# Patient Record
Sex: Male | Born: 1950 | Race: White | Hispanic: No | Marital: Married
Health system: Southern US, Community
[De-identification: ages and names within clinical notes are randomized; demographics above are authoritative.]

---

## 1998-02-22 ENCOUNTER — Other Ambulatory Visit: Admission: RE | Admit: 1998-02-22 | Discharge: 1998-02-22 | Payer: Self-pay | Admitting: Internal Medicine

## 1999-10-14 ENCOUNTER — Ambulatory Visit (HOSPITAL_COMMUNITY): Admission: RE | Admit: 1999-10-14 | Discharge: 1999-10-14 | Payer: Self-pay | Admitting: *Deleted

## 2000-12-24 ENCOUNTER — Ambulatory Visit (HOSPITAL_COMMUNITY): Admission: RE | Admit: 2000-12-24 | Discharge: 2000-12-24 | Payer: Self-pay | Admitting: *Deleted

## 2003-04-10 ENCOUNTER — Ambulatory Visit (HOSPITAL_COMMUNITY): Admission: RE | Admit: 2003-04-10 | Discharge: 2003-04-10 | Payer: Self-pay | Admitting: *Deleted

## 2003-05-03 ENCOUNTER — Ambulatory Visit (HOSPITAL_COMMUNITY): Admission: RE | Admit: 2003-05-03 | Discharge: 2003-05-03 | Payer: Self-pay | Admitting: *Deleted

## 2003-05-24 ENCOUNTER — Ambulatory Visit (HOSPITAL_COMMUNITY): Admission: RE | Admit: 2003-05-24 | Discharge: 2003-05-24 | Payer: Self-pay | Admitting: Internal Medicine

## 2004-08-22 IMAGING — CR DG CHEST 2V
2 series · 2 of 2 positions shown · non-contrast
Comparison: none

CLINICAL DATA: Persistent cough for three months.  Dyspnea with exertion.

[view not recorded (1 of 2)]
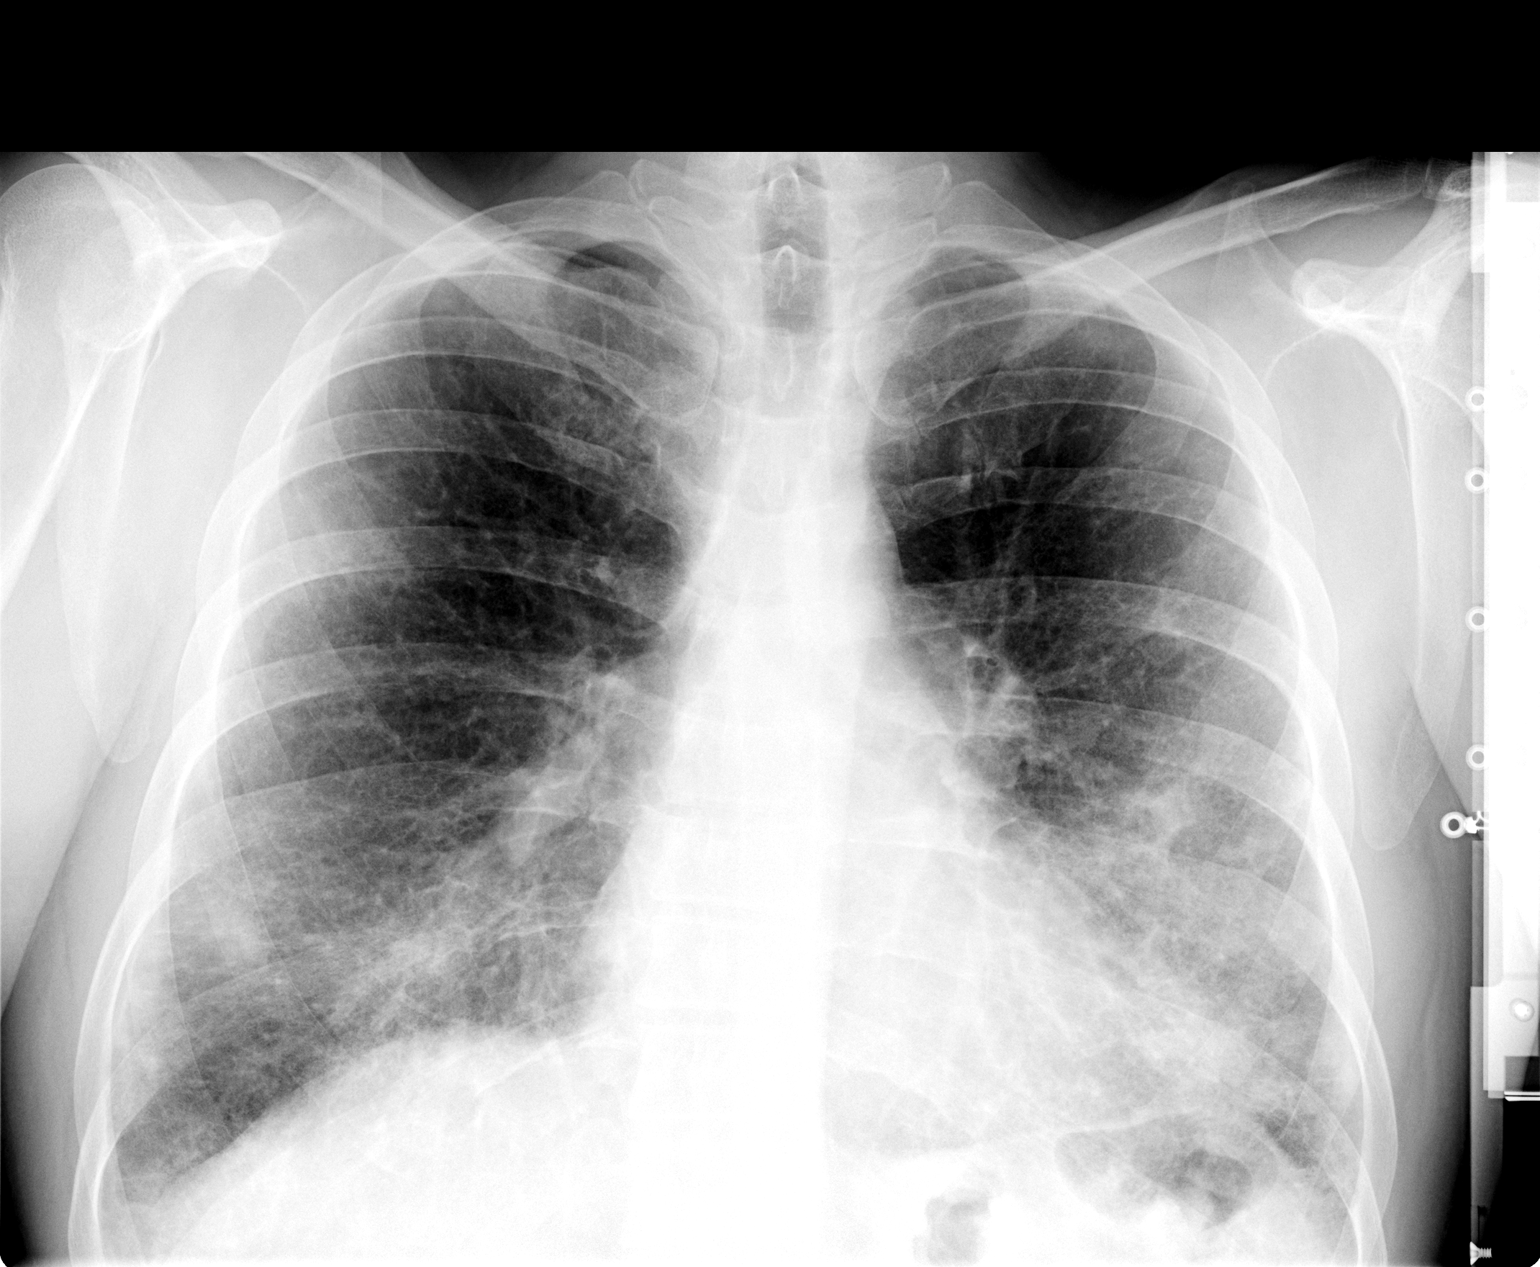

[view not recorded (2 of 2)]
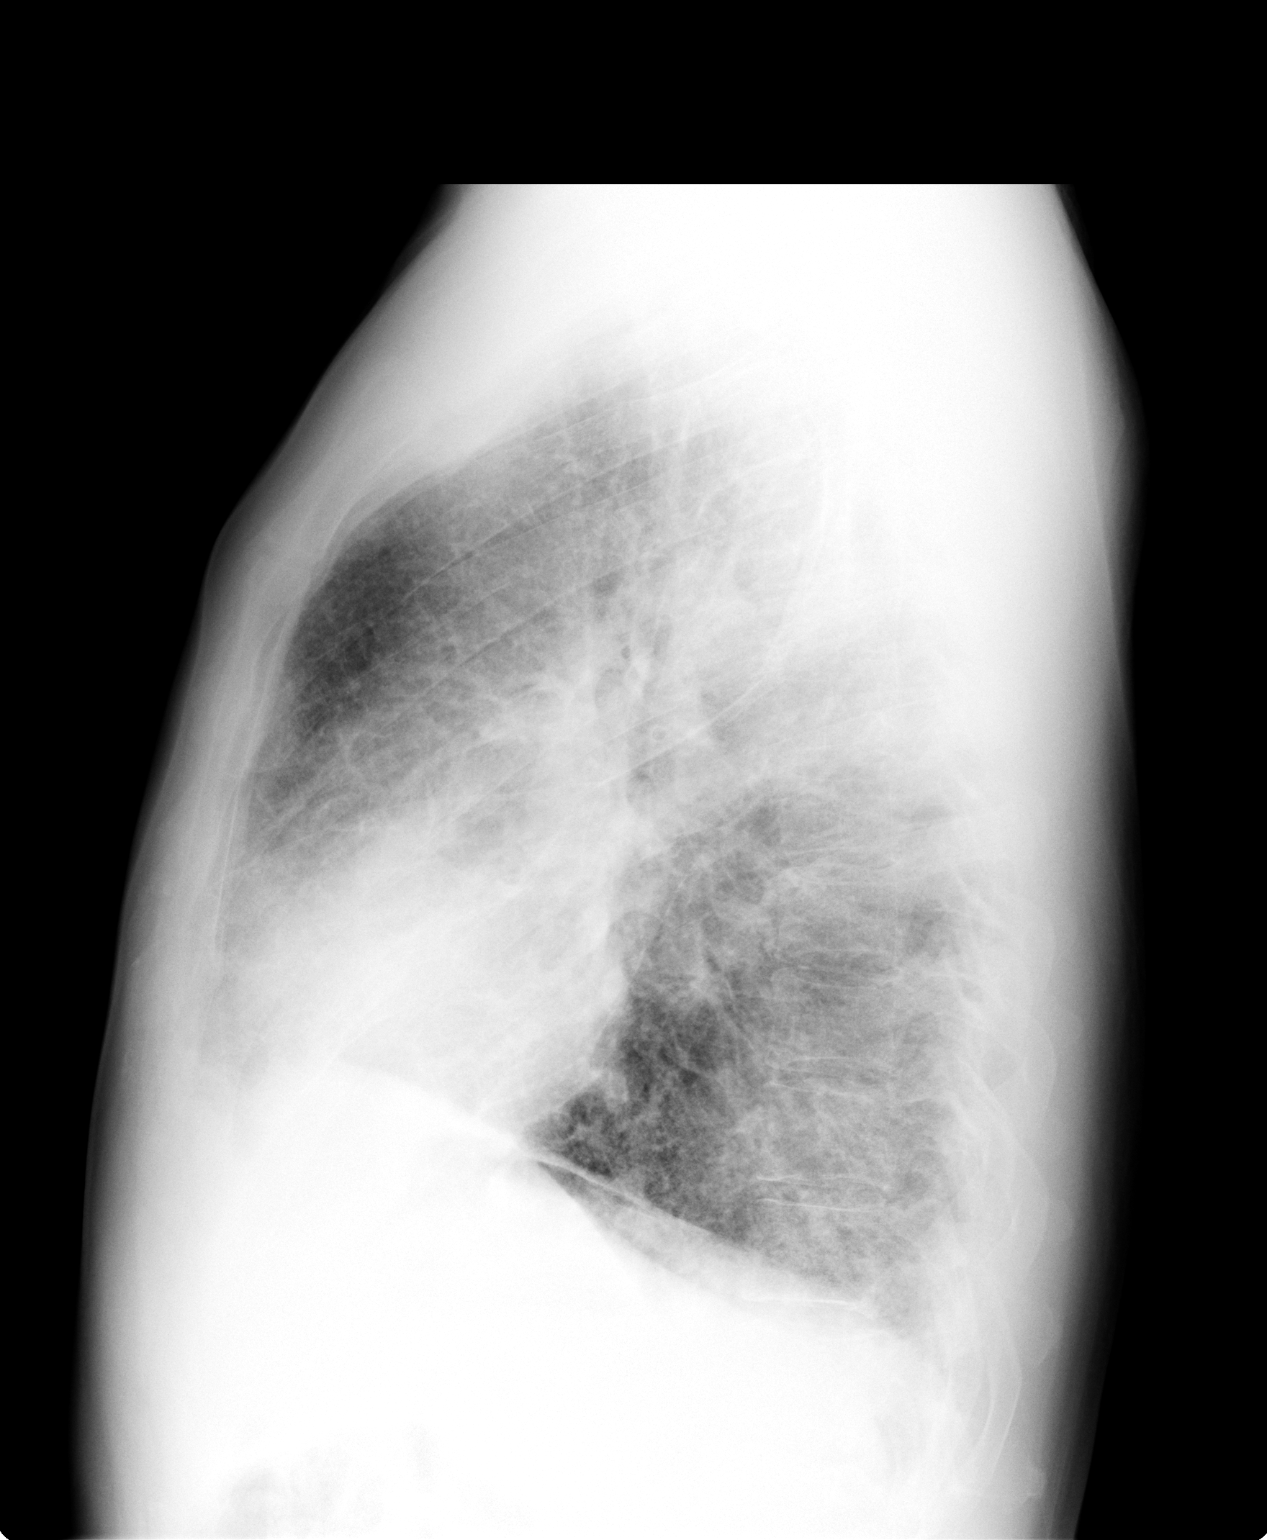

[2 of 2 positions shown; findings below may reference images not displayed]

PA AND LATERAL CHEST
 No comparison.  The heart size is normal.  There is no evidence of mediastinal or hilar adenopathy.  There is diffuse interstitial prominence with peribronchial thickening.  Patchy opacities are present in the right middle lobe and lingula.  There is no consolidation or significant pleural effusion.  Osseous structures appear unremarkable. 

 IMPRESSION
 Abnormal chest radiograph demonstrating diffuse interstitial prominence and patchy right middle lobe and lingular opacities.  Although potentially chronic, without prior exams, I cannot exclude superimposed pneumonia.  Correlation with any old films the patient may have would be helpful.  In their absence, I would suggest short term (1-2 week) chest radiographic follow-up. 

 [REDACTED]

## 2004-10-05 IMAGING — CT CT CHEST W/O CM
1 of 3 series · 14 of 30 positions shown, 18 images · non-contrast
Comparison: none

CLINICAL DATA: Abnormal chest x-ray/fatigue/decreased O2 saturations.
CT CHEST WITHOUT CONTRAST
TECHNIQUE: Multidetector helical imaging carried out through the chest without IV contrast.  Today?s study is correlated with prior chest x-rays from 04/10/03 and 05/03/03.
There are patchy lung densities scattered throughout the lungs bilaterally.  They are most notable at the anterior aspect of the left lung base.  To a lesser degree these are present in the lung bases bilaterally .  There is also some left upper lobe patchy disease.  There are no air bronchograms at the present time.  The changes are less impressive on CT than they are on the patient?s recent chest x-ray, but one cannot accurately compare the two modalities.  I suspect the findings are most consistent with incompletely resolved pneumonia.  There are a few small nodules noted.  On image #40, there is a 4-5 mm nodule in the anterior aspect of the right lower lobe.  On image #34, there is a 6-7 mm nodule in the medial aspect of the left upper lobe, abutting the mediastinum.  No obvious airway occlusion.  No significant mediastinal adenopathy, given the limitations of scanning without IV contrast.  There do appear to be some scattered lymph nodes in the mediastinum in the 10-12 mm size range.  
IMPRESSION
1.  Patchy bilateral airspace disease ? likely improved when compared to recent chest x-rays, but an accurate comparison between CT and chest x-ray is not possible. 
2.  There are at least two small lung nodules.  
3.  Follow up unenhanced CT recommended in two to three months, to further assess #1 and #2.

[Series 4: — · axial · 0.80mm/px · z∈[+859,+1189]mm · 14 of 78 slices shown, 18 images]
[im 6/78  mediastinal]
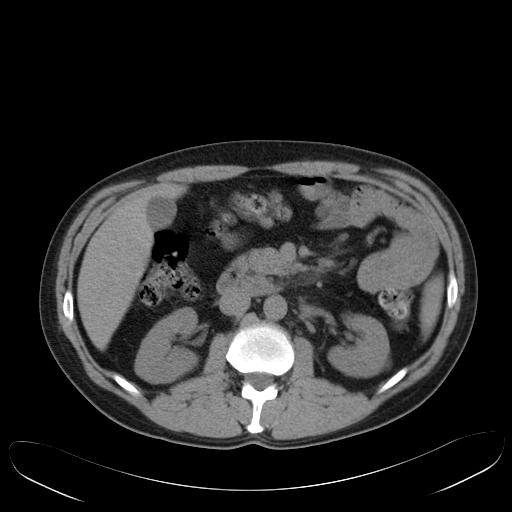
[im 6/78  lung]
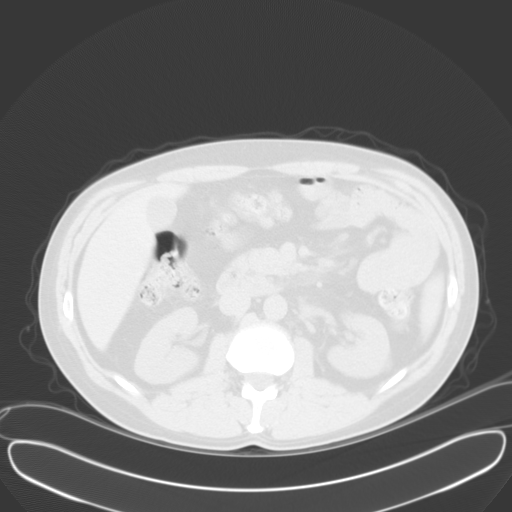
[im 12/78  lung]
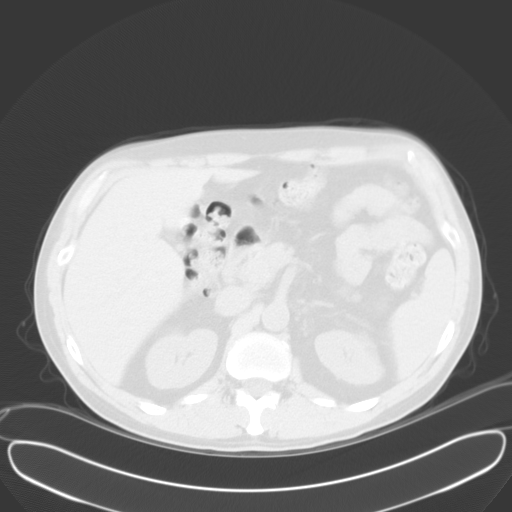
[im 17/78  lung]
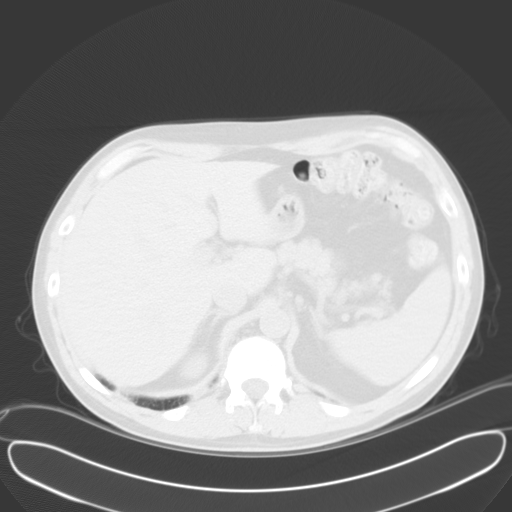
[im 23/78  lung]
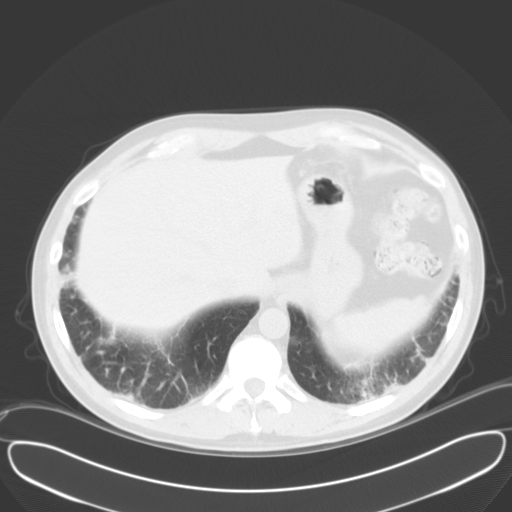
[im 28/78  mediastinal]
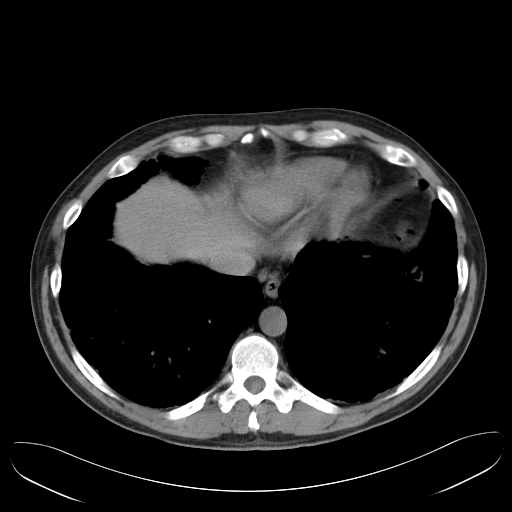
[im 28/78  lung]
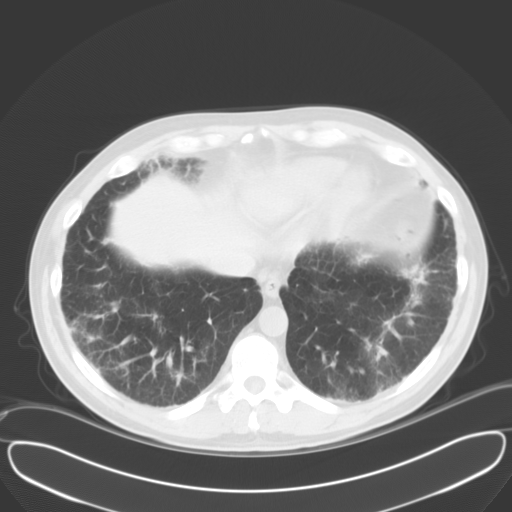
[im 34/78  lung]
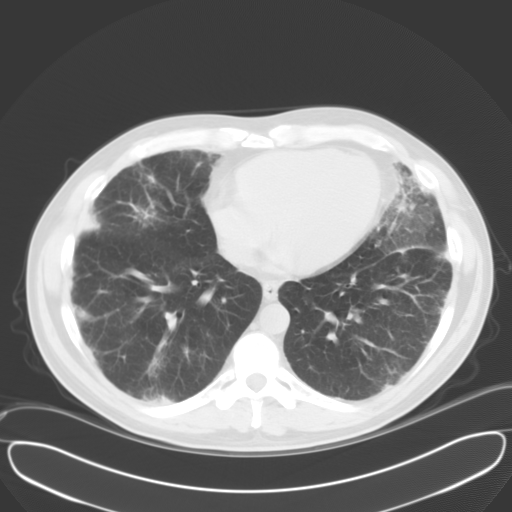
[im 38/78  lung]
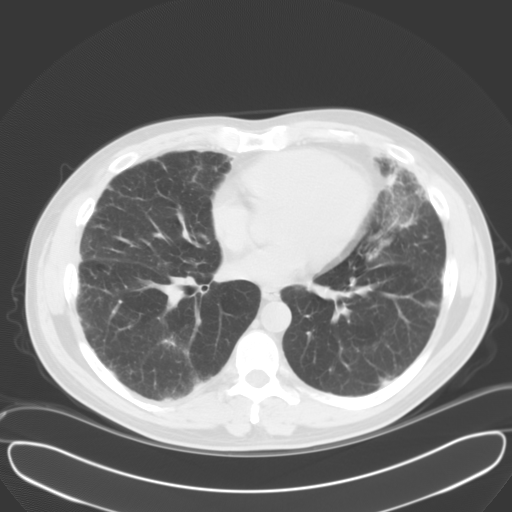
[im 39/78  lung]
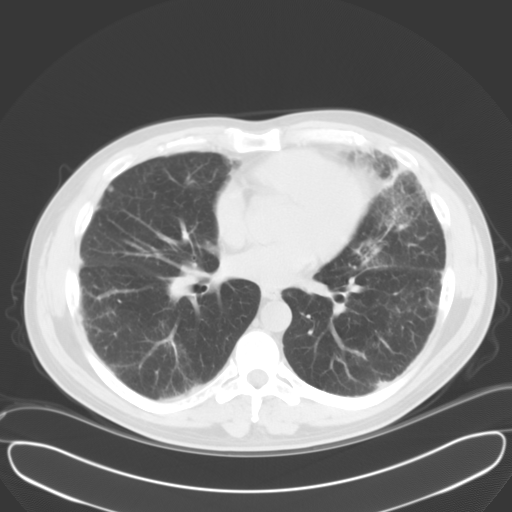
[im 45/78  mediastinal]
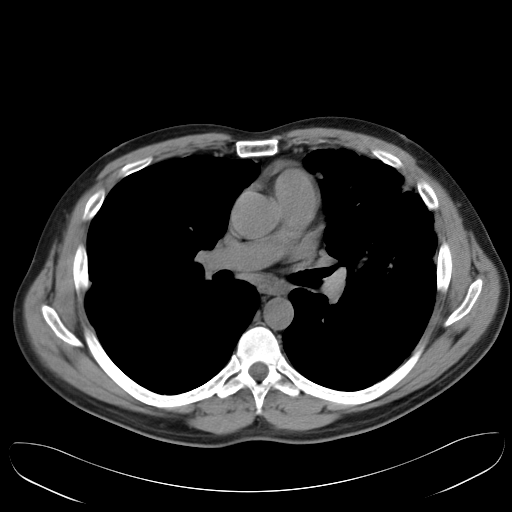
[im 45/78  lung]
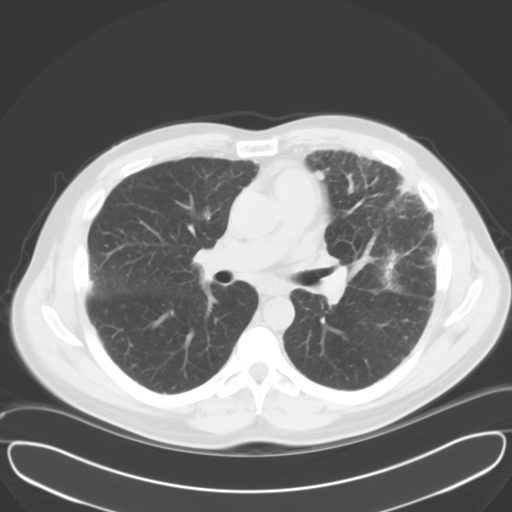
[im 50/78  lung]
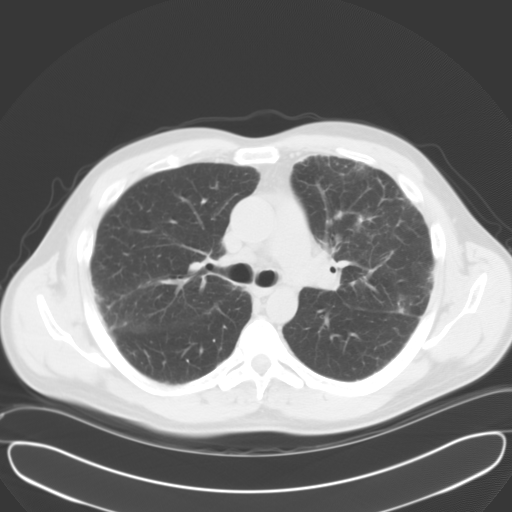
[im 56/78  lung]
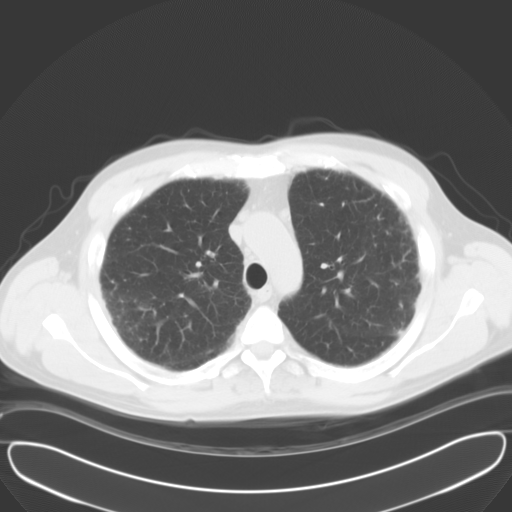
[im 61/78  lung]
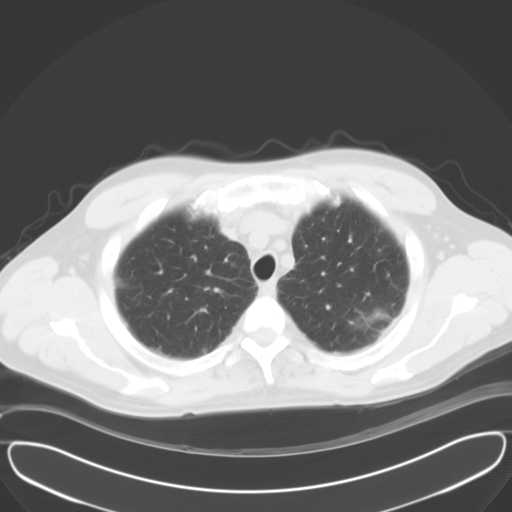
[im 67/78  mediastinal]
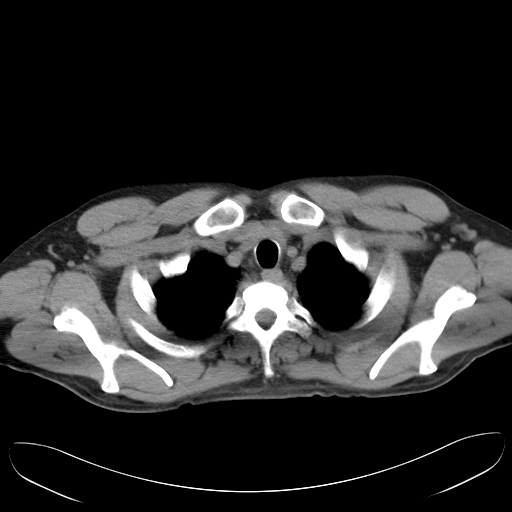
[im 67/78  lung]
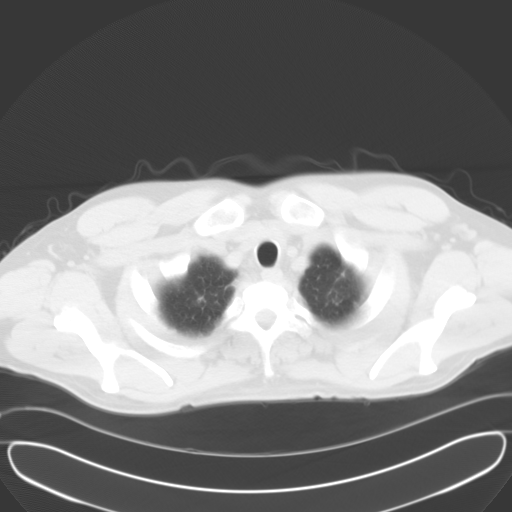
[im 72/78  lung]
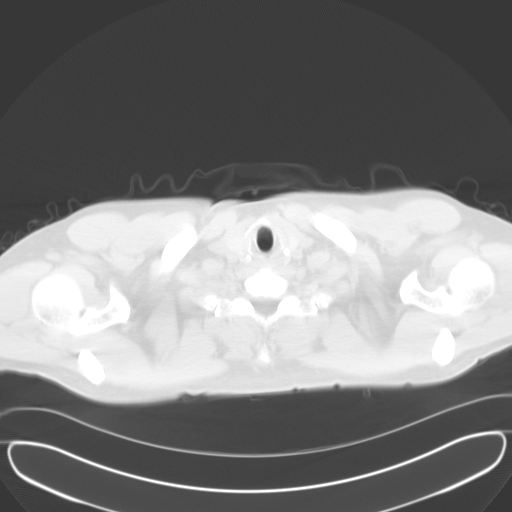

[14 of 30 positions shown; findings below may reference images not displayed]

## 2013-05-26 ENCOUNTER — Other Ambulatory Visit: Payer: Self-pay | Admitting: Gastroenterology

## 2017-06-18 ENCOUNTER — Ambulatory Visit: Payer: Self-pay

## 2017-06-18 ENCOUNTER — Ambulatory Visit (INDEPENDENT_AMBULATORY_CARE_PROVIDER_SITE_OTHER): Payer: Managed Care, Other (non HMO) | Admitting: Podiatry

## 2017-06-18 ENCOUNTER — Ambulatory Visit (INDEPENDENT_AMBULATORY_CARE_PROVIDER_SITE_OTHER): Payer: Managed Care, Other (non HMO)

## 2017-06-18 DIAGNOSIS — M2021 Hallux rigidus, right foot: Secondary | ICD-10-CM

## 2017-06-18 DIAGNOSIS — M19079 Primary osteoarthritis, unspecified ankle and foot: Secondary | ICD-10-CM | POA: Diagnosis not present

## 2017-06-18 DIAGNOSIS — M79671 Pain in right foot: Secondary | ICD-10-CM | POA: Diagnosis not present

## 2017-06-18 DIAGNOSIS — M2022 Hallux rigidus, left foot: Secondary | ICD-10-CM | POA: Diagnosis not present

## 2017-06-18 DIAGNOSIS — M79672 Pain in left foot: Secondary | ICD-10-CM

## 2017-06-18 MED ORDER — MELOXICAM 15 MG PO TABS: 15.0000 mg | ORAL_TABLET | Freq: Every day | ORAL | 0 refills | Status: DC

## 2017-06-20 NOTE — Progress Notes (Signed)
  Subjective:  Patient ID: Frank Patel, male    DOB: 08/01/1950,  MRN: [id number]  Chief Complaint  Patient presents with  . Foot Pain    bilateral x years - has become very difficult to live with   68 y.o. male presents with the above complaint. Reports pain to both great toe joint. Present for many years. Denies prior treatments.  No past medical history on file.  Current Outpatient Medications:  .  meloxicam (MOBIC) 15 MG tablet, Take 1 tablet (15 mg total) by mouth daily., Disp: 30 tablet, Rfl: 0  Allergies not on file Review of Systems: Negative except as noted in the HPI. Denies N/V/F/Ch. Objective:  There were no vitals filed for this visit. General AA&O x3. Normal mood and affect.  Vascular Dorsalis pedis and posterior tibial pulses  present 2+ bilaterally  Capillary refill normal to all digits. Pedal hair growth normal.  Neurologic Epicritic sensation grossly present.  Dermatologic No open lesions. Interspaces clear of maceration. Nails well groomed and normal in appearance.  Orthopedic: MMT 5/5 in dorsiflexion, plantarflexion, inversion, and eversion. Decreased 1st MPJ ROM bilat with pain on rom.   Assessment & Plan:  Patient was evaluated and treated and all questions answered.  Hallux Rigidus -XR taken and reviewed. 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 -Injection delivered R 1st MPJ -Discussed possible joint replacement in the future if pain persists. -Rx Meloxicam  Procedure: Joint Injection Location: Right 1st MPJ joint Skin Prep: Alcohol. Injectate: 0.5 cc 1% lidocaine plain, 0.5 cc dexamethasone phosphate. Disposition: Patient tolerated procedure well. Injection site dressed with a band-aid.    Return in about 3 weeks (around 07/09/2017) for Arthritis,.

## 2017-07-22 ENCOUNTER — Other Ambulatory Visit: Payer: Self-pay

## 2017-07-22 ENCOUNTER — Ambulatory Visit (INDEPENDENT_AMBULATORY_CARE_PROVIDER_SITE_OTHER): Payer: Managed Care, Other (non HMO) | Admitting: Orthotics

## 2017-07-22 ENCOUNTER — Ambulatory Visit (INDEPENDENT_AMBULATORY_CARE_PROVIDER_SITE_OTHER): Payer: Managed Care, Other (non HMO) | Admitting: Podiatry

## 2017-07-22 DIAGNOSIS — M2022 Hallux rigidus, left foot: Secondary | ICD-10-CM

## 2017-07-22 DIAGNOSIS — M19079 Primary osteoarthritis, unspecified ankle and foot: Secondary | ICD-10-CM

## 2017-07-22 DIAGNOSIS — M2021 Hallux rigidus, right foot: Secondary | ICD-10-CM

## 2017-07-22 NOTE — Progress Notes (Signed)
  Subjective:  Patient ID: Frank Patel, male    DOB: [birth year],  MRN: [id number]  No chief complaint on file.  67 y.o. male returns for the above complaint. States the injections only helped for a couple days. Here for orthotic fabrication and discussion of possible surgery.  Objective:  There were no vitals filed for this visit. General AA&O x3. Normal mood and affect.  Vascular Pedal pulses palpable.  Neurologic Epicritic sensation grossly intact.  Dermatologic No open lesions. Skin normal texture and turgor.  Orthopedic: Pain and limited ROM bilateral 1st MPJ   Assessment & Plan:  Patient was evaluated and treated and all questions answered.  Hallux LImitus Bilat -Injections didn't help much. -Will fabricate CMOs with Morton's extension. -Discussed fusion vs replacement of the big toe joint. All r/b/a discussed. Questions answered at length. Patient will think it over and we will discuss further at next visit. Will see if orthotics can help.   15 minutes of face to face time were spent with the patient. >50% of this was spent on counseling and coordination of care. Specifically discussed with patient the above diagnoses and overall treatment plan.   No follow-ups on file.

## 2017-07-22 NOTE — Progress Notes (Signed)
Patient came into today to be cast for Custom Foot Orthotics. Upon recommendation of Dr. 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

## 2017-08-07 ENCOUNTER — Other Ambulatory Visit: Payer: Self-pay | Admitting: Podiatry

## 2017-08-12 ENCOUNTER — Ambulatory Visit (INDEPENDENT_AMBULATORY_CARE_PROVIDER_SITE_OTHER): Payer: Managed Care, Other (non HMO) | Admitting: Orthotics

## 2017-08-12 DIAGNOSIS — M19079 Primary osteoarthritis, unspecified ankle and foot: Secondary | ICD-10-CM

## 2017-08-12 DIAGNOSIS — M2022 Hallux rigidus, left foot: Principal | ICD-10-CM

## 2017-08-12 DIAGNOSIS — M2021 Hallux rigidus, right foot: Secondary | ICD-10-CM

## 2017-08-12 NOTE — Progress Notes (Signed)
Patient came in today to pick up custom made foot orthotics.  The goals were accomplished and the patient reported no dissatisfaction with said orthotics.  Patient was advised of breakin period and how to report any issues. 

## 2017-09-02 ENCOUNTER — Ambulatory Visit (INDEPENDENT_AMBULATORY_CARE_PROVIDER_SITE_OTHER): Payer: Managed Care, Other (non HMO) | Admitting: Podiatry

## 2017-09-02 DIAGNOSIS — M2022 Hallux rigidus, left foot: Secondary | ICD-10-CM

## 2017-09-02 DIAGNOSIS — M19079 Primary osteoarthritis, unspecified ankle and foot: Secondary | ICD-10-CM

## 2017-09-02 DIAGNOSIS — M2021 Hallux rigidus, right foot: Secondary | ICD-10-CM

## 2017-09-02 NOTE — Patient Instructions (Signed)
Pre-Operative Instructions  Congratulations, you have decided to take an important step towards improving your quality of life.  You can be assured that the doctors and staff at [facility] will be with you every step of the way.  Here are some important things you should know:  1. Plan to be at the surgery center/hospital at least 1 (one) hour prior to your scheduled time, unless otherwise directed by the surgical center/hospital staff.  You must have a responsible adult accompany you, remain during the surgery and drive you home.  Make sure you have directions to the surgical center/hospital to ensure you arrive on time. 2. If you are having surgery at [facility] or [facility], you will need a copy of your medical history and physical form from your family physician within one month prior to the date of surgery. We will give you a form for your primary physician to complete.  3. We make every effort to accommodate the date you request for surgery.  However, there are times where surgery dates or times have to be moved.  We will contact you as soon as possible if a change in schedule is required.   4. No aspirin/ibuprofen for one week before surgery.  If you are on aspirin, any non-steroidal anti-inflammatory medications (Mobic, Aleve, Ibuprofen) should not be taken seven (7) days prior to your surgery.  You make take Tylenol for pain prior to surgery.  5. Medications - If you are taking daily heart and blood pressure medications, seizure, reflux, allergy, asthma, anxiety, pain or diabetes medications, make sure you notify the surgery center/hospital before the day of surgery so they can tell you which medications you should take or avoid the day of surgery. 6. No food or drink after midnight the night before surgery unless directed otherwise by surgical center/hospital staff. 7. No alcoholic beverages 24-hours prior to surgery.  No smoking 24-hours prior or 24-hours after  surgery. 8. Wear loose pants or shorts. They should be loose enough to fit over bandages, boots, and casts. 9. Don't wear slip-on shoes. Sneakers are preferred. 10. Bring your boot with you to the surgery center/hospital.  Also bring crutches or a walker if your physician has prescribed it for you.  If you do not have this equipment, it will be provided for you after surgery. 11. If you have not been contacted by the surgery center/hospital by the day before your surgery, call to confirm the date and time of your surgery. 12. Leave-time from work may vary depending on the type of surgery you have.  Appropriate arrangements should be made prior to surgery with your employer. 13. Prescriptions will be provided immediately following surgery by your doctor.  Fill these as soon as possible after surgery and take the medication as directed. Pain medications will not be refilled on weekends and must be approved by the doctor. 14. Remove nail polish on the operative foot and avoid getting pedicures prior to surgery. 15. Wash the night before surgery.  The night before surgery wash the foot and leg well with water and the antibacterial soap provided. Be sure to pay special attention to beneath the toenails and in between the toes.  Wash for at least three (3) minutes. Rinse thoroughly with water and dry well with a towel.  Perform this wash unless told not to do so by your physician.  Enclosed: 1 Ice pack (please put in freezer the night before surgery)   1 Hibiclens skin cleaner     Pre-op instructions  If you have any questions regarding the instructions, please do not hesitate to call our office.  [place]: [address], [place], [place] [address] -- [phone]  [place]: [address] [place], [place] [address] -- [phone]  [place]: [address]  [place], [place] [address] -- [phone]  [place]: [address], [address], [place], [place] [address] -- [phone]  Website:  [email or web address] 

## 2017-09-02 NOTE — Progress Notes (Signed)
  Subjective:  Patient ID: Frank Patel, male    DOB: Jul 28, 1950,  MRN: [id number]  Chief Complaint  Patient presents with  . Foot Orthotics    6 week follow up orthotics   67 y.o. male returns for the above complaint.  States that orthotics are helping some but he still having pain especially when he does a lot of activity.  Would like to further discuss surgical intervention as discussed last visit.  Has more pain to his left foot than his right  Objective:  There were no vitals filed for this visit. General AA&O x3. Normal mood and affect.  Vascular Pedal pulses palpable.  Neurologic Epicritic sensation grossly intact.  Dermatologic No open lesions. Skin normal texture and turgor.  Orthopedic:  Hallux rigidus bilateral with pain on attempted range of motion limited range of motion bilaterally.   Assessment & Plan:  Patient was evaluated and treated and all questions answered.  Hallux rigidus bilateral left greater than right -Discussed continued conservative versus surgical care hallux rigidus patient elects for surgical intervention.  Discussed benefit of performing great toe joint replacement.  Patient wishes to proceed.  We will plan for procedure in the coming weeks.  Consent reviewed and signed by patient all response alternatives explained to patient no guarantees given.  Specific discussed the patient that he may need to have a second surgery later on should the joint breakdown or loosen.  Return for for post-op care.

## 2017-09-08 ENCOUNTER — Telehealth: Payer: Self-pay | Admitting: *Deleted

## 2017-09-08 NOTE — Telephone Encounter (Signed)
"  This is Frank Patel.  I was there last week to see Dr. March Rummage.  We scheduled surgery for July 17.  I don't know what time to show up at [facility].  I didn't see a time on any of my information.  I also wanted to ask if the surgical center does the insurance certification or if your office does the insurance certification?  I'd appreciate a call back."  I called and left him a message that I will get authorization for the professional fee and the facility as well as anesthesia will get authorization for their fees as well.  I told him he doesn't have to do anything.  I also informed him that someone from the surgical center would call him a day or two prior to his surgery date and they would give him his arrival time.  I asked him to call if he has any other questions.

## 2017-09-19 ENCOUNTER — Other Ambulatory Visit: Payer: Self-pay | Admitting: Podiatry

## 2017-09-22 ENCOUNTER — Encounter: Payer: Self-pay | Admitting: Podiatry

## 2017-09-22 ENCOUNTER — Other Ambulatory Visit: Payer: Self-pay | Admitting: Podiatry

## 2017-09-22 DIAGNOSIS — M2012 Hallux valgus (acquired), left foot: Secondary | ICD-10-CM | POA: Diagnosis not present

## 2017-09-22 MED ORDER — OXYCODONE-ACETAMINOPHEN 10-325 MG PO TABS: 1.0000 | ORAL_TABLET | ORAL | 0 refills | Status: DC | PRN

## 2017-09-22 MED ORDER — CEPHALEXIN 500 MG PO CAPS: 500.0000 mg | ORAL_CAPSULE | Freq: Two times a day (BID) | ORAL | 0 refills | Status: AC

## 2017-09-22 MED ORDER — ONDANSETRON HCL 4 MG PO TABS: 4.0000 mg | ORAL_TABLET | Freq: Three times a day (TID) | ORAL | 0 refills | Status: AC | PRN

## 2017-09-24 ENCOUNTER — Ambulatory Visit (INDEPENDENT_AMBULATORY_CARE_PROVIDER_SITE_OTHER): Payer: Managed Care, Other (non HMO)

## 2017-09-24 ENCOUNTER — Encounter: Payer: Self-pay | Admitting: Podiatry

## 2017-09-24 ENCOUNTER — Ambulatory Visit (INDEPENDENT_AMBULATORY_CARE_PROVIDER_SITE_OTHER): Payer: Self-pay | Admitting: Podiatry

## 2017-09-24 VITALS — Temp 96.6°F

## 2017-09-24 DIAGNOSIS — M2022 Hallux rigidus, left foot: Secondary | ICD-10-CM

## 2017-09-24 DIAGNOSIS — M2021 Hallux rigidus, right foot: Secondary | ICD-10-CM

## 2017-09-24 MED ORDER — OXYCODONE-ACETAMINOPHEN 10-325 MG PO TABS: 1.0000 | ORAL_TABLET | ORAL | 0 refills | Status: AC | PRN

## 2017-09-26 NOTE — Progress Notes (Signed)
  Subjective:  Patient ID: Frank Patel, male    DOB: 1950/05/15,  MRN: [id number]  Chief Complaint  Patient presents with  . Routine Post [facility] 07.17.2019 [facility] Implant Lt " the block wore off, and my foot hurts now"     DOS: 09/22/17 Procedure: Left 1st MPJ implant arthroplasty.  67 y.o. male returns for post-op check. Denies N/V/F/Ch. Pain is controlled with current medications.  States the pain was controlled until the block wore off.  Objective:   General AA&O x3. Normal mood and affect.  Vascular Foot warm and well perfused.  Neurologic Gross sensation intact.  Dermatologic Skin healing well without signs of infection. Skin edges well coapted without signs of infection.  Orthopedic: Tenderness to palpation noted about the surgical site.    Assessment & Plan:  Patient was evaluated and treated and all questions answered.  S/p Left 1st MPJ implant arthroplasty. -Progressing as expected post-operatively. -Sutures: intact. -Medications refilled: Percocet -Foot redressed.  No follow-ups on file.

## 2017-10-01 ENCOUNTER — Encounter: Payer: Self-pay | Admitting: Podiatry

## 2017-10-01 ENCOUNTER — Ambulatory Visit (INDEPENDENT_AMBULATORY_CARE_PROVIDER_SITE_OTHER): Payer: Self-pay | Admitting: Podiatry

## 2017-10-01 DIAGNOSIS — M2022 Hallux rigidus, left foot: Secondary | ICD-10-CM | POA: Diagnosis not present

## 2017-10-01 DIAGNOSIS — M2021 Hallux rigidus, right foot: Secondary | ICD-10-CM

## 2017-10-02 NOTE — Progress Notes (Signed)
Subjective:   Patient ID: Frank Patel, male   DOB: 67 y.o.   MRN: [id number]   HPI Patient states overall doing pretty good and states that he has been walking around the house without his boot or any form of immobilization.  Several weeks after having implant procedure of the first MPJ left   ROS      Objective:  Physical Exam  Neurovascular status intact negative Homans sign noted the patient does have moderate excessive swelling in the foot and has not been taking proper care of it and has been ambulating without his boot against instructions a physician.  His incision site overall is healing well with slight distal breakdown that is very localized with no proximal edema erythema or drainage noted     Assessment:  Overall doing well the patient has been noncompliant in his walking     Plan:  I reviewed with him that he has to keep it immobilized in the importance of doing this and I did discuss the edema in his foot is probably due to his excessive activity.  At this point I discussed the importance of elevation immobilization and compression and I reapplied sterile dressing and instructed on the importance of using the boot of the shoe.  Patient will be seen back to recheck again in the next few weeks by Dr. Halina Maidens indicates that the implant is seated well

## 2017-10-07 ENCOUNTER — Ambulatory Visit (INDEPENDENT_AMBULATORY_CARE_PROVIDER_SITE_OTHER): Payer: Managed Care, Other (non HMO)

## 2017-10-07 DIAGNOSIS — M2022 Hallux rigidus, left foot: Secondary | ICD-10-CM | POA: Diagnosis not present

## 2017-10-07 DIAGNOSIS — M2021 Hallux rigidus, right foot: Secondary | ICD-10-CM

## 2017-10-07 NOTE — Progress Notes (Signed)
Patient is here today for postoperative visit.  Date of surgery 09/22/2017.  Keller bunion implant left foot.  He says today that he is doing better and is currently wearing his Darco shoe without any pain or complications.  Noted well-healing surgical incision.  Mild swelling to the area but swelling has improved since last visit.  No redness, erythema, no drainage, no other signs and symptoms of infection.  Maceration to surgical areas resolved.  Patient's vitals are stable, blood pressure 132/78 temperature 98.2.  Patient is to remain ambulating in Darco shoe and follow-up in 2 weeks.

## 2017-10-22 ENCOUNTER — Ambulatory Visit (INDEPENDENT_AMBULATORY_CARE_PROVIDER_SITE_OTHER): Payer: Managed Care, Other (non HMO) | Admitting: Podiatry

## 2017-10-22 DIAGNOSIS — Z9889 Other specified postprocedural states: Secondary | ICD-10-CM

## 2017-10-22 MED ORDER — MELOXICAM 15 MG PO TABS: 15.0000 mg | ORAL_TABLET | Freq: Every day | ORAL | 0 refills | Status: AC

## 2017-10-24 NOTE — Progress Notes (Signed)
  Subjective:  Patient ID: Frank Patel, male    DOB: 05-08-1950,  MRN: [id number]  Chief Complaint  Patient presents with  . Routine Post [facility] 07.17.2019 [name] Implant Lt    DOS: 09/22/17 Procedure: Left 1st MPJ implant arthroplasty.  67 y.o. male returns for post-op check. Denies N/V/F/Ch.  Denies pain doing well.  States that he gets a little sore if he is on too much.  Has been more compliant with using his shoe.  Objective:   General AA&O x3. Normal mood and affect.  Vascular Foot warm and well perfused.  Neurologic Gross sensation intact.  Dermatologic  skin well-healed thin scar  Orthopedic:  No tenderness to palpation noted about the surgical site.  Good range of motion of the first MPJ    Assessment & Plan:  Patient was evaluated and treated and all questions answered.  S/p Left 1st MPJ implant arthroplasty. -Progressing as expected post-operatively. -Medications refilled: None -Continue weightbearing as tolerated -Transition slowly to normal shoe gear  Return in about 4 weeks (around 11/19/2017) for Post-op.

## 2017-11-19 ENCOUNTER — Ambulatory Visit (INDEPENDENT_AMBULATORY_CARE_PROVIDER_SITE_OTHER): Payer: Managed Care, Other (non HMO)

## 2017-11-19 ENCOUNTER — Ambulatory Visit (INDEPENDENT_AMBULATORY_CARE_PROVIDER_SITE_OTHER): Payer: Managed Care, Other (non HMO) | Admitting: Podiatry

## 2017-11-19 ENCOUNTER — Other Ambulatory Visit: Payer: Self-pay | Admitting: Podiatry

## 2017-11-19 DIAGNOSIS — M2022 Hallux rigidus, left foot: Secondary | ICD-10-CM

## 2017-11-19 DIAGNOSIS — M2021 Hallux rigidus, right foot: Secondary | ICD-10-CM

## 2017-11-19 DIAGNOSIS — Z9889 Other specified postprocedural states: Secondary | ICD-10-CM

## 2017-11-19 NOTE — Progress Notes (Signed)
  Subjective:  Patient ID: Frank Patel, male    DOB: May 09, 1950,  MRN: [id number]  Chief Complaint  Patient presents with  . Routine Post [facility] 07.17.2019 [name] Implant Lt     DOS: 09/22/17 Procedure: [name] Implant L foot  67 y.o. male returns for post-op check. Doing very well denies pain in his feet. Has intermittent swelling and wears his compression socks but no complaints. States he is even able to operate his motorcycle now without pain.  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 Pain worst at the end of the day. Not pain in his left great toe.  Review of Systems: Negative except as noted in the HPI. Denies N/V/F/Ch.  No past medical history on file.  Current Outpatient Medications:  .  APRISO 0.375 g 24 hr capsule, TAKE 4 CAPSULES IN THE MORNING ONCE A DAY ORALLY 30, Disp: , Rfl: 5 .  atenolol (TENORMIN) 25 MG tablet, Take 25 mg by mouth daily., Disp: , Rfl: 1 .  azaTHIOprine (IMURAN) 50 MG tablet, Take 100 mg by mouth daily., Disp: , Rfl: 5 .  cephALEXin (KEFLEX) 500 MG capsule, Take 1 capsule (500 mg total) by mouth 2 (two) times daily., Disp: 14 capsule, Rfl: 0 .  meloxicam (MOBIC) 15 MG tablet, Take 1 tablet (15 mg total) by mouth daily., Disp: 30 tablet, Rfl: 0 .  ondansetron (ZOFRAN) 4 MG tablet, Take 1 tablet (4 mg total) by mouth every 8 (eight) hours as needed for nausea or vomiting., Disp: 20 tablet, Rfl: 0 .  oxyCODONE-acetaminophen (PERCOCET) 10-325 MG tablet, Take 1 tablet by mouth every 4 (four) hours as needed for pain., Disp: 20 tablet, Rfl: 0  Social History   Tobacco Use  Smoking Status Not on file    Allergies  Allergen Reactions  . Sulfa Antibiotics    Objective:  There were no vitals filed for this visit. There is no height or weight on file to calculate BMI. Constitutional Well developed. Well nourished.  Vascular Foot warm and well perfused. Capillary refill normal to all digits.   Neurologic Normal  speech. Oriented to person, place, and time. Epicritic sensation to light touch grossly present bilaterally.  Dermatologic Skin well healed with thin scar.  Orthopedic: No tenderness to palpation L 1st MPJ. No pain on ROM. Good 1st MPJ ROM noted.   Radiographs: Taken and reviewed. Stable implant position without failure Assessment:   1. Hallux rigidus, bilateral   2. Post-operative state    Plan:  Patient was evaluated and treated and all questions answered.  S/p foot surgery left -Progressing as expected post-operatively. 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 -Discussed compression socks. -Foot redressed.  Return if symptoms worsen or fail to improve.

## 2018-04-11 DIAGNOSIS — I10 Essential (primary) hypertension: Secondary | ICD-10-CM | POA: Diagnosis not present

## 2018-04-11 DIAGNOSIS — H2511 Age-related nuclear cataract, right eye: Secondary | ICD-10-CM | POA: Diagnosis not present

## 2018-04-11 DIAGNOSIS — H35372 Puckering of macula, left eye: Secondary | ICD-10-CM | POA: Diagnosis not present

## 2018-04-11 DIAGNOSIS — Z0000 Encounter for general adult medical examination without abnormal findings: Secondary | ICD-10-CM | POA: Diagnosis not present

## 2018-04-11 DIAGNOSIS — S0502XD Injury of conjunctiva and corneal abrasion without foreign body, left eye, subsequent encounter: Secondary | ICD-10-CM | POA: Diagnosis not present

## 2018-04-11 DIAGNOSIS — H2512 Age-related nuclear cataract, left eye: Secondary | ICD-10-CM | POA: Diagnosis not present

## 2018-04-11 DIAGNOSIS — Z23 Encounter for immunization: Secondary | ICD-10-CM | POA: Diagnosis not present

## 2018-04-11 DIAGNOSIS — H43813 Vitreous degeneration, bilateral: Secondary | ICD-10-CM | POA: Diagnosis not present

## 2018-04-11 DIAGNOSIS — N183 Chronic kidney disease, stage 3 (moderate): Secondary | ICD-10-CM | POA: Diagnosis not present

## 2018-04-11 DIAGNOSIS — Z1159 Encounter for screening for other viral diseases: Secondary | ICD-10-CM | POA: Diagnosis not present

## 2018-04-21 DIAGNOSIS — H268 Other specified cataract: Secondary | ICD-10-CM | POA: Diagnosis not present

## 2018-04-21 DIAGNOSIS — H2512 Age-related nuclear cataract, left eye: Secondary | ICD-10-CM | POA: Diagnosis not present

## 2018-04-21 DIAGNOSIS — H52211 Irregular astigmatism, right eye: Secondary | ICD-10-CM | POA: Diagnosis not present

## 2018-05-16 ENCOUNTER — Encounter: Payer: Self-pay | Admitting: *Deleted

## 2018-05-18 DIAGNOSIS — Z961 Presence of intraocular lens: Secondary | ICD-10-CM | POA: Diagnosis not present

## 2018-08-24 DIAGNOSIS — K5130 Ulcerative (chronic) rectosigmoiditis without complications: Secondary | ICD-10-CM | POA: Diagnosis not present

## 2018-08-25 DIAGNOSIS — K5130 Ulcerative (chronic) rectosigmoiditis without complications: Secondary | ICD-10-CM | POA: Diagnosis not present

## 2018-11-16 DIAGNOSIS — Z23 Encounter for immunization: Secondary | ICD-10-CM | POA: Diagnosis not present

## 2018-11-21 DIAGNOSIS — Z961 Presence of intraocular lens: Secondary | ICD-10-CM | POA: Diagnosis not present

## 2018-11-21 DIAGNOSIS — H35372 Puckering of macula, left eye: Secondary | ICD-10-CM | POA: Diagnosis not present

## 2018-11-21 DIAGNOSIS — H43813 Vitreous degeneration, bilateral: Secondary | ICD-10-CM | POA: Diagnosis not present

## 2018-11-21 DIAGNOSIS — H2511 Age-related nuclear cataract, right eye: Secondary | ICD-10-CM | POA: Diagnosis not present

## 2018-12-15 DIAGNOSIS — C4441 Basal cell carcinoma of skin of scalp and neck: Secondary | ICD-10-CM | POA: Diagnosis not present

## 2018-12-15 DIAGNOSIS — D485 Neoplasm of uncertain behavior of skin: Secondary | ICD-10-CM | POA: Diagnosis not present

## 2018-12-15 DIAGNOSIS — Z23 Encounter for immunization: Secondary | ICD-10-CM | POA: Diagnosis not present

## 2018-12-15 DIAGNOSIS — L820 Inflamed seborrheic keratosis: Secondary | ICD-10-CM | POA: Diagnosis not present

## 2019-02-09 DIAGNOSIS — C4441 Basal cell carcinoma of skin of scalp and neck: Secondary | ICD-10-CM | POA: Diagnosis not present

## 2019-03-29 DIAGNOSIS — I10 Essential (primary) hypertension: Secondary | ICD-10-CM | POA: Diagnosis not present

## 2019-04-06 ENCOUNTER — Ambulatory Visit: Payer: Self-pay

## 2019-04-11 ENCOUNTER — Ambulatory Visit: Payer: Self-pay

## 2019-04-15 ENCOUNTER — Ambulatory Visit: Payer: PPO | Attending: Internal Medicine

## 2019-04-15 DIAGNOSIS — Z23 Encounter for immunization: Secondary | ICD-10-CM

## 2019-04-15 NOTE — Progress Notes (Signed)
   Covid-19 Vaccination Clinic  Name:  Frank Patel    MRN: [id number] DOB: [birth year]  04/15/2019  [name] was observed post Covid-19 immunization for 15 minutes without incidence. He was provided with Vaccine Information Sheet and instruction to access the V-Safe system.   [name] was instructed to call 911 with any severe reactions post vaccine: Marland Kitchen Difficulty breathing  . Swelling of your face and throat  . A fast heartbeat  . A bad rash all over your body  . Dizziness and weakness    Immunizations Administered    Name Date Dose VIS Date Route   Pfizer COVID-19 Vaccine 04/15/2019  9:03 AM 0.3 mL 02/17/2019 Intramuscular   Manufacturer: [place]   Lot: [id number]   [place]: [id number]

## 2019-05-09 ENCOUNTER — Ambulatory Visit: Payer: PPO

## 2019-05-09 ENCOUNTER — Ambulatory Visit: Payer: PPO | Attending: Internal Medicine

## 2019-05-09 DIAGNOSIS — Z23 Encounter for immunization: Secondary | ICD-10-CM | POA: Insufficient documentation

## 2019-05-09 NOTE — Progress Notes (Signed)
   Covid-19 Vaccination Clinic  Name:  Frank Patel    MRN: [id number] DOB: 09-22-1950  05/09/2019  Frank Patel was observed post Covid-19 immunization for 15 minutes without incident. He was provided with Vaccine Information Sheet and instruction to access the V-Safe system.   Frank Patel was instructed to call 911 with any severe reactions post vaccine: Marland Kitchen Difficulty breathing  . Swelling of face and throat  . A fast heartbeat  . A bad rash all over body  . Dizziness and weakness   Immunizations Administered    Name Date Dose VIS Date Route   Pfizer COVID-19 Vaccine 05/09/2019 11:37 AM 0.3 mL 02/17/2019 Intramuscular   Manufacturer: [place]   Lot: [id number]   [place]: [id number]

## 2019-05-22 DIAGNOSIS — N183 Chronic kidney disease, stage 3 unspecified: Secondary | ICD-10-CM | POA: Diagnosis not present

## 2019-05-22 DIAGNOSIS — K5130 Ulcerative (chronic) rectosigmoiditis without complications: Secondary | ICD-10-CM | POA: Diagnosis not present

## 2019-05-22 DIAGNOSIS — Z0000 Encounter for general adult medical examination without abnormal findings: Secondary | ICD-10-CM | POA: Diagnosis not present

## 2019-05-22 DIAGNOSIS — N2581 Secondary hyperparathyroidism of renal origin: Secondary | ICD-10-CM | POA: Diagnosis not present

## 2019-05-22 DIAGNOSIS — E785 Hyperlipidemia, unspecified: Secondary | ICD-10-CM | POA: Diagnosis not present

## 2019-05-22 DIAGNOSIS — I10 Essential (primary) hypertension: Secondary | ICD-10-CM | POA: Diagnosis not present

## 2019-05-22 DIAGNOSIS — N189 Chronic kidney disease, unspecified: Secondary | ICD-10-CM | POA: Diagnosis not present

## 2019-05-23 DIAGNOSIS — Z961 Presence of intraocular lens: Secondary | ICD-10-CM | POA: Diagnosis not present

## 2019-05-23 DIAGNOSIS — H35372 Puckering of macula, left eye: Secondary | ICD-10-CM | POA: Diagnosis not present

## 2019-05-23 DIAGNOSIS — H2511 Age-related nuclear cataract, right eye: Secondary | ICD-10-CM | POA: Diagnosis not present

## 2019-05-23 DIAGNOSIS — H43813 Vitreous degeneration, bilateral: Secondary | ICD-10-CM | POA: Diagnosis not present

## 2019-06-27 DIAGNOSIS — D2271 Melanocytic nevi of right lower limb, including hip: Secondary | ICD-10-CM | POA: Diagnosis not present

## 2019-06-27 DIAGNOSIS — L570 Actinic keratosis: Secondary | ICD-10-CM | POA: Diagnosis not present

## 2019-06-27 DIAGNOSIS — L814 Other melanin hyperpigmentation: Secondary | ICD-10-CM | POA: Diagnosis not present

## 2019-06-27 DIAGNOSIS — L578 Other skin changes due to chronic exposure to nonionizing radiation: Secondary | ICD-10-CM | POA: Diagnosis not present

## 2019-06-27 DIAGNOSIS — L821 Other seborrheic keratosis: Secondary | ICD-10-CM | POA: Diagnosis not present

## 2019-06-27 DIAGNOSIS — D239 Other benign neoplasm of skin, unspecified: Secondary | ICD-10-CM | POA: Diagnosis not present

## 2019-06-27 DIAGNOSIS — Z85828 Personal history of other malignant neoplasm of skin: Secondary | ICD-10-CM | POA: Diagnosis not present

## 2019-06-27 DIAGNOSIS — D1801 Hemangioma of skin and subcutaneous tissue: Secondary | ICD-10-CM | POA: Diagnosis not present

## 2019-10-17 DIAGNOSIS — K5130 Ulcerative (chronic) rectosigmoiditis without complications: Secondary | ICD-10-CM | POA: Diagnosis not present

## 2019-11-27 DIAGNOSIS — Z23 Encounter for immunization: Secondary | ICD-10-CM | POA: Diagnosis not present

## 2020-01-09 ENCOUNTER — Ambulatory Visit: Payer: PPO | Attending: Internal Medicine

## 2020-01-09 DIAGNOSIS — Z23 Encounter for immunization: Secondary | ICD-10-CM

## 2020-01-09 NOTE — Progress Notes (Signed)
   Covid-19 Vaccination Clinic  Name:  Frank Patel    MRN: [id number] DOB: 1950/09/03  01/09/2020  Frank Patel was observed post Covid-19 immunization for 15 minutes without incident. He was provided with Vaccine Information Sheet and instruction to access the V-Safe system.   Frank Patel was instructed to call 911 with any severe reactions post vaccine: Marland Kitchen Difficulty breathing  . Swelling of face and throat  . A fast heartbeat  . A bad rash all over body  . Dizziness and weakness

## 2020-03-15 DIAGNOSIS — J988 Other specified respiratory disorders: Secondary | ICD-10-CM | POA: Diagnosis not present

## 2020-03-15 DIAGNOSIS — U071 COVID-19: Secondary | ICD-10-CM | POA: Diagnosis not present

## 2020-05-24 DIAGNOSIS — H43813 Vitreous degeneration, bilateral: Secondary | ICD-10-CM | POA: Diagnosis not present

## 2020-05-24 DIAGNOSIS — Z961 Presence of intraocular lens: Secondary | ICD-10-CM | POA: Diagnosis not present

## 2020-05-24 DIAGNOSIS — H2511 Age-related nuclear cataract, right eye: Secondary | ICD-10-CM | POA: Diagnosis not present

## 2020-05-24 DIAGNOSIS — H35372 Puckering of macula, left eye: Secondary | ICD-10-CM | POA: Diagnosis not present

## 2020-05-30 DIAGNOSIS — I10 Essential (primary) hypertension: Secondary | ICD-10-CM | POA: Diagnosis not present

## 2020-05-30 DIAGNOSIS — N183 Chronic kidney disease, stage 3 unspecified: Secondary | ICD-10-CM | POA: Diagnosis not present

## 2020-05-30 DIAGNOSIS — E785 Hyperlipidemia, unspecified: Secondary | ICD-10-CM | POA: Diagnosis not present

## 2020-05-30 DIAGNOSIS — N2581 Secondary hyperparathyroidism of renal origin: Secondary | ICD-10-CM | POA: Diagnosis not present

## 2020-05-30 DIAGNOSIS — Z0000 Encounter for general adult medical examination without abnormal findings: Secondary | ICD-10-CM | POA: Diagnosis not present

## 2020-06-19 DIAGNOSIS — N183 Chronic kidney disease, stage 3 unspecified: Secondary | ICD-10-CM | POA: Diagnosis not present

## 2020-06-19 DIAGNOSIS — I10 Essential (primary) hypertension: Secondary | ICD-10-CM | POA: Diagnosis not present

## 2020-06-19 DIAGNOSIS — N189 Chronic kidney disease, unspecified: Secondary | ICD-10-CM | POA: Diagnosis not present

## 2020-06-19 DIAGNOSIS — E785 Hyperlipidemia, unspecified: Secondary | ICD-10-CM | POA: Diagnosis not present

## 2020-06-19 DIAGNOSIS — N2581 Secondary hyperparathyroidism of renal origin: Secondary | ICD-10-CM | POA: Diagnosis not present

## 2020-09-02 DIAGNOSIS — L570 Actinic keratosis: Secondary | ICD-10-CM | POA: Diagnosis not present

## 2020-09-02 DIAGNOSIS — D2271 Melanocytic nevi of right lower limb, including hip: Secondary | ICD-10-CM | POA: Diagnosis not present

## 2020-09-02 DIAGNOSIS — D1801 Hemangioma of skin and subcutaneous tissue: Secondary | ICD-10-CM | POA: Diagnosis not present

## 2020-09-02 DIAGNOSIS — L578 Other skin changes due to chronic exposure to nonionizing radiation: Secondary | ICD-10-CM | POA: Diagnosis not present

## 2020-09-02 DIAGNOSIS — L821 Other seborrheic keratosis: Secondary | ICD-10-CM | POA: Diagnosis not present

## 2020-09-02 DIAGNOSIS — L814 Other melanin hyperpigmentation: Secondary | ICD-10-CM | POA: Diagnosis not present

## 2020-09-02 DIAGNOSIS — Z85828 Personal history of other malignant neoplasm of skin: Secondary | ICD-10-CM | POA: Diagnosis not present

## 2020-09-28 DIAGNOSIS — I10 Essential (primary) hypertension: Secondary | ICD-10-CM | POA: Diagnosis not present

## 2020-09-28 DIAGNOSIS — N2581 Secondary hyperparathyroidism of renal origin: Secondary | ICD-10-CM | POA: Diagnosis not present

## 2020-11-15 DIAGNOSIS — D7589 Other specified diseases of blood and blood-forming organs: Secondary | ICD-10-CM | POA: Diagnosis not present

## 2020-11-15 DIAGNOSIS — K5130 Ulcerative (chronic) rectosigmoiditis without complications: Secondary | ICD-10-CM | POA: Diagnosis not present

## 2020-11-15 DIAGNOSIS — N189 Chronic kidney disease, unspecified: Secondary | ICD-10-CM | POA: Diagnosis not present

## 2020-12-31 DIAGNOSIS — E785 Hyperlipidemia, unspecified: Secondary | ICD-10-CM | POA: Diagnosis not present

## 2020-12-31 DIAGNOSIS — N189 Chronic kidney disease, unspecified: Secondary | ICD-10-CM | POA: Diagnosis not present

## 2020-12-31 DIAGNOSIS — N2581 Secondary hyperparathyroidism of renal origin: Secondary | ICD-10-CM | POA: Diagnosis not present

## 2020-12-31 DIAGNOSIS — N183 Chronic kidney disease, stage 3 unspecified: Secondary | ICD-10-CM | POA: Diagnosis not present

## 2020-12-31 DIAGNOSIS — I10 Essential (primary) hypertension: Secondary | ICD-10-CM | POA: Diagnosis not present

## 2021-02-03 DIAGNOSIS — J029 Acute pharyngitis, unspecified: Secondary | ICD-10-CM | POA: Diagnosis not present

## 2021-02-03 DIAGNOSIS — J069 Acute upper respiratory infection, unspecified: Secondary | ICD-10-CM | POA: Diagnosis not present

## 2021-02-03 DIAGNOSIS — Z03818 Encounter for observation for suspected exposure to other biological agents ruled out: Secondary | ICD-10-CM | POA: Diagnosis not present

## 2021-02-03 DIAGNOSIS — R051 Acute cough: Secondary | ICD-10-CM | POA: Diagnosis not present

## 2021-02-20 DIAGNOSIS — K5289 Other specified noninfective gastroenteritis and colitis: Secondary | ICD-10-CM | POA: Diagnosis not present

## 2021-02-24 DIAGNOSIS — K5289 Other specified noninfective gastroenteritis and colitis: Secondary | ICD-10-CM | POA: Diagnosis not present

## 2021-06-02 DIAGNOSIS — Z0000 Encounter for general adult medical examination without abnormal findings: Secondary | ICD-10-CM | POA: Diagnosis not present

## 2021-06-02 DIAGNOSIS — H2511 Age-related nuclear cataract, right eye: Secondary | ICD-10-CM | POA: Diagnosis not present

## 2021-06-02 DIAGNOSIS — H43813 Vitreous degeneration, bilateral: Secondary | ICD-10-CM | POA: Diagnosis not present

## 2021-06-02 DIAGNOSIS — H26492 Other secondary cataract, left eye: Secondary | ICD-10-CM | POA: Diagnosis not present

## 2021-06-02 DIAGNOSIS — N183 Chronic kidney disease, stage 3 unspecified: Secondary | ICD-10-CM | POA: Diagnosis not present

## 2021-06-02 DIAGNOSIS — H35373 Puckering of macula, bilateral: Secondary | ICD-10-CM | POA: Diagnosis not present

## 2021-06-02 DIAGNOSIS — Z961 Presence of intraocular lens: Secondary | ICD-10-CM | POA: Diagnosis not present

## 2021-06-02 DIAGNOSIS — I10 Essential (primary) hypertension: Secondary | ICD-10-CM | POA: Diagnosis not present

## 2021-09-02 DIAGNOSIS — L814 Other melanin hyperpigmentation: Secondary | ICD-10-CM | POA: Diagnosis not present

## 2021-09-02 DIAGNOSIS — L578 Other skin changes due to chronic exposure to nonionizing radiation: Secondary | ICD-10-CM | POA: Diagnosis not present

## 2021-09-02 DIAGNOSIS — L821 Other seborrheic keratosis: Secondary | ICD-10-CM | POA: Diagnosis not present

## 2021-09-02 DIAGNOSIS — D2271 Melanocytic nevi of right lower limb, including hip: Secondary | ICD-10-CM | POA: Diagnosis not present

## 2021-09-02 DIAGNOSIS — Z85828 Personal history of other malignant neoplasm of skin: Secondary | ICD-10-CM | POA: Diagnosis not present

## 2021-11-25 DIAGNOSIS — M79672 Pain in left foot: Secondary | ICD-10-CM | POA: Diagnosis not present

## 2021-11-25 DIAGNOSIS — M79671 Pain in right foot: Secondary | ICD-10-CM | POA: Diagnosis not present

## 2021-11-25 DIAGNOSIS — Z23 Encounter for immunization: Secondary | ICD-10-CM | POA: Diagnosis not present

## 2022-06-08 DIAGNOSIS — H26492 Other secondary cataract, left eye: Secondary | ICD-10-CM | POA: Diagnosis not present

## 2022-06-08 DIAGNOSIS — H2511 Age-related nuclear cataract, right eye: Secondary | ICD-10-CM | POA: Diagnosis not present

## 2022-06-08 DIAGNOSIS — Z961 Presence of intraocular lens: Secondary | ICD-10-CM | POA: Diagnosis not present

## 2022-06-08 DIAGNOSIS — H35373 Puckering of macula, bilateral: Secondary | ICD-10-CM | POA: Diagnosis not present

## 2022-06-08 DIAGNOSIS — H43813 Vitreous degeneration, bilateral: Secondary | ICD-10-CM | POA: Diagnosis not present

## 2022-06-09 DIAGNOSIS — N189 Chronic kidney disease, unspecified: Secondary | ICD-10-CM | POA: Diagnosis not present

## 2022-06-09 DIAGNOSIS — N2581 Secondary hyperparathyroidism of renal origin: Secondary | ICD-10-CM | POA: Diagnosis not present

## 2022-06-09 DIAGNOSIS — I10 Essential (primary) hypertension: Secondary | ICD-10-CM | POA: Diagnosis not present

## 2022-06-09 DIAGNOSIS — N1831 Chronic kidney disease, stage 3a: Secondary | ICD-10-CM | POA: Diagnosis not present

## 2022-06-09 DIAGNOSIS — N183 Chronic kidney disease, stage 3 unspecified: Secondary | ICD-10-CM | POA: Diagnosis not present

## 2022-06-09 DIAGNOSIS — E785 Hyperlipidemia, unspecified: Secondary | ICD-10-CM | POA: Diagnosis not present

## 2022-06-09 DIAGNOSIS — Z0000 Encounter for general adult medical examination without abnormal findings: Secondary | ICD-10-CM | POA: Diagnosis not present

## 2022-06-09 DIAGNOSIS — K5130 Ulcerative (chronic) rectosigmoiditis without complications: Secondary | ICD-10-CM | POA: Diagnosis not present

## 2022-08-10 DIAGNOSIS — K5130 Ulcerative (chronic) rectosigmoiditis without complications: Secondary | ICD-10-CM | POA: Diagnosis not present

## 2022-09-30 DIAGNOSIS — D2271 Melanocytic nevi of right lower limb, including hip: Secondary | ICD-10-CM | POA: Diagnosis not present

## 2022-09-30 DIAGNOSIS — C44222 Squamous cell carcinoma of skin of right ear and external auricular canal: Secondary | ICD-10-CM | POA: Diagnosis not present

## 2022-09-30 DIAGNOSIS — L578 Other skin changes due to chronic exposure to nonionizing radiation: Secondary | ICD-10-CM | POA: Diagnosis not present

## 2022-09-30 DIAGNOSIS — Z85828 Personal history of other malignant neoplasm of skin: Secondary | ICD-10-CM | POA: Diagnosis not present

## 2022-09-30 DIAGNOSIS — L821 Other seborrheic keratosis: Secondary | ICD-10-CM | POA: Diagnosis not present

## 2022-09-30 DIAGNOSIS — D485 Neoplasm of uncertain behavior of skin: Secondary | ICD-10-CM | POA: Diagnosis not present

## 2022-09-30 DIAGNOSIS — L814 Other melanin hyperpigmentation: Secondary | ICD-10-CM | POA: Diagnosis not present

## 2022-10-21 DIAGNOSIS — U071 COVID-19: Secondary | ICD-10-CM | POA: Diagnosis not present

## 2022-10-21 DIAGNOSIS — R051 Acute cough: Secondary | ICD-10-CM | POA: Diagnosis not present

## 2022-11-12 DIAGNOSIS — C44222 Squamous cell carcinoma of skin of right ear and external auricular canal: Secondary | ICD-10-CM | POA: Diagnosis not present

## 2022-11-18 DIAGNOSIS — L570 Actinic keratosis: Secondary | ICD-10-CM | POA: Diagnosis not present

## 2022-12-14 DIAGNOSIS — K5130 Ulcerative (chronic) rectosigmoiditis without complications: Secondary | ICD-10-CM | POA: Diagnosis not present
